# Patient Record
Sex: Female | Born: 1980 | Hispanic: No | Marital: Married | Smoking: Never smoker
Health system: Southern US, Community
[De-identification: ages and names within clinical notes are randomized; demographics above are authoritative.]

## PROBLEM LIST (undated history)

## (undated) ENCOUNTER — Emergency Department (HOSPITAL_BASED_OUTPATIENT_CLINIC_OR_DEPARTMENT_OTHER): Admission: EM | Payer: Self-pay | Source: Home / Self Care

## (undated) HISTORY — PX: BREAST SURGERY: SHX581

## (undated) HISTORY — PX: REDUCTION MAMMAPLASTY: SUR839

---

## 2002-10-22 ENCOUNTER — Other Ambulatory Visit: Admission: RE | Admit: 2002-10-22 | Discharge: 2002-10-22 | Payer: Self-pay | Admitting: Obstetrics and Gynecology

## 2003-04-18 ENCOUNTER — Inpatient Hospital Stay (HOSPITAL_COMMUNITY): Admission: AD | Admit: 2003-04-18 | Discharge: 2003-04-21 | Payer: Self-pay | Admitting: Obstetrics and Gynecology

## 2003-04-19 ENCOUNTER — Encounter (INDEPENDENT_AMBULATORY_CARE_PROVIDER_SITE_OTHER): Payer: Self-pay | Admitting: *Deleted

## 2005-06-20 ENCOUNTER — Inpatient Hospital Stay (HOSPITAL_COMMUNITY): Admission: AD | Admit: 2005-06-20 | Discharge: 2005-06-22 | Payer: Self-pay | Admitting: Obstetrics

## 2009-04-02 ENCOUNTER — Emergency Department (HOSPITAL_BASED_OUTPATIENT_CLINIC_OR_DEPARTMENT_OTHER): Admission: EM | Admit: 2009-04-02 | Discharge: 2009-04-02 | Payer: Self-pay | Admitting: Emergency Medicine

## 2009-04-02 ENCOUNTER — Ambulatory Visit: Payer: Self-pay | Admitting: Diagnostic Radiology

## 2009-04-03 ENCOUNTER — Emergency Department (HOSPITAL_BASED_OUTPATIENT_CLINIC_OR_DEPARTMENT_OTHER): Admission: EM | Admit: 2009-04-03 | Discharge: 2009-04-04 | Payer: Self-pay | Admitting: Emergency Medicine

## 2009-04-04 ENCOUNTER — Ambulatory Visit: Payer: Self-pay | Admitting: Diagnostic Radiology

## 2010-05-29 LAB — COMPREHENSIVE METABOLIC PANEL
ALT: 32 U/L (ref 0–35)
AST: 53 U/L — ABNORMAL HIGH (ref 0–37)
Albumin: 3.7 g/dL (ref 3.5–5.2)
Alkaline Phosphatase: 80 U/L (ref 39–117)
BUN: 10 mg/dL (ref 6–23)
CO2: 30 mEq/L (ref 19–32)
Calcium: 8.8 mg/dL (ref 8.4–10.5)
Chloride: 103 mEq/L (ref 96–112)
Creatinine, Ser: 0.6 mg/dL (ref 0.4–1.2)
GFR calc Af Amer: >60 mL/min (ref >60)
GFR calc non Af Amer: >60 mL/min (ref >60)
Glucose, Bld: 95 mg/dL (ref 70–99)
Potassium: 3.8 mEq/L (ref 3.5–5.1)
Sodium: 139 mEq/L (ref 135–145)
Total Bilirubin: 0.2 mg/dL — ABNORMAL LOW (ref 0.3–1.2)
Total Protein: 6.6 g/dL (ref 6.0–8.3)

## 2010-05-29 LAB — URINALYSIS, ROUTINE W REFLEX MICROSCOPIC
Bilirubin Urine: NEGATIVE
Glucose, UA: NEGATIVE mg/dL
Hgb urine dipstick: NEGATIVE
Ketones, ur: NEGATIVE mg/dL
Nitrite: NEGATIVE
Protein, ur: NEGATIVE mg/dL
Specific Gravity, Urine: 1.011 (ref 1.005–1.030)
Urobilinogen, UA: 0.2 mg/dL (ref 0.0–1.0)
pH: 6 (ref 5.0–8.0)

## 2010-05-29 LAB — DIFFERENTIAL
Basophils Absolute: 0 10*3/uL (ref 0.0–0.1)
Basophils Relative: 0 % (ref 0–1)
Eosinophils Absolute: 0.1 10*3/uL (ref 0.0–0.7)
Eosinophils Relative: 1 % (ref 0–5)
Lymphocytes Relative: 12 % (ref 12–46)
Lymphs Abs: 1.2 10*3/uL (ref 0.7–4.0)
Monocytes Absolute: 0.4 10*3/uL (ref 0.1–1.0)
Monocytes Relative: 4 % (ref 3–12)
Neutro Abs: 7.9 10*3/uL — ABNORMAL HIGH (ref 1.7–7.7)
Neutrophils Relative %: 83 % — ABNORMAL HIGH (ref 43–77)

## 2010-05-29 LAB — CBC
HCT: 28.6 % — ABNORMAL LOW (ref 36.0–46.0)
Hemoglobin: 9.1 g/dL — ABNORMAL LOW (ref 12.0–15.0)
MCHC: 32 g/dL (ref 30.0–36.0)
MCV: 67.6 fL — ABNORMAL LOW (ref 78.0–100.0)
Platelets: 309 10*3/uL (ref 150–400)
RBC: 4.23 MIL/uL (ref 3.87–5.11)
RDW: 16.2 % — ABNORMAL HIGH (ref 11.5–15.5)
WBC: 9.6 10*3/uL (ref 4.0–10.5)

## 2010-05-29 LAB — POCT CARDIAC MARKERS
CKMB, poc: <1 ng/mL — ABNORMAL LOW (ref 1.0–8.0)
Myoglobin, poc: 28.9 ng/mL (ref 12–200)
Troponin i, poc: <0.05 ng/mL (ref 0.00–0.09)

## 2010-05-29 LAB — PREGNANCY, URINE: Preg Test, Ur: NEGATIVE

## 2010-05-29 LAB — LIPASE, BLOOD: Lipase: 99 U/L (ref 23–300)

## 2011-03-13 HISTORY — PX: CHOLECYSTECTOMY: SHX55

## 2018-05-06 ENCOUNTER — Encounter: Payer: Self-pay | Admitting: Student

## 2018-05-26 ENCOUNTER — Telehealth: Payer: Self-pay | Admitting: Family Medicine

## 2018-05-26 NOTE — Telephone Encounter (Signed)
Attempted to call patient to inform her about the office restrictions due to the coronavirus. No answer, left a detailed message with the restrictions along with office number if needing to reschedule.

## 2018-05-27 ENCOUNTER — Ambulatory Visit: Payer: Self-pay | Admitting: Student

## 2018-05-29 ENCOUNTER — Encounter: Payer: Self-pay | Admitting: *Deleted

## 2020-03-06 ENCOUNTER — Other Ambulatory Visit: Payer: Self-pay

## 2020-03-06 ENCOUNTER — Emergency Department (HOSPITAL_BASED_OUTPATIENT_CLINIC_OR_DEPARTMENT_OTHER)
Admission: EM | Admit: 2020-03-06 | Discharge: 2020-03-06 | Disposition: A | Discharge status: Home / Self Care | Payer: BLUE CROSS/BLUE SHIELD | Attending: Emergency Medicine | Admitting: Emergency Medicine

## 2020-03-06 ENCOUNTER — Encounter (HOSPITAL_BASED_OUTPATIENT_CLINIC_OR_DEPARTMENT_OTHER): Payer: Self-pay | Admitting: Emergency Medicine

## 2020-03-06 DIAGNOSIS — N61 Mastitis without abscess: Secondary | ICD-10-CM | POA: Insufficient documentation

## 2020-03-06 LAB — COMPREHENSIVE METABOLIC PANEL
ALT: 20 U/L (ref 0–44)
AST: 19 U/L (ref 15–41)
Albumin: 3.7 g/dL (ref 3.5–5.0)
Alkaline Phosphatase: 67 U/L (ref 38–126)
Anion gap: 9 (ref 5–15)
BUN: 7 mg/dL (ref 6–20)
CO2: 26 mmol/L (ref 22–32)
Calcium: 9.3 mg/dL (ref 8.9–10.3)
Chloride: 101 mmol/L (ref 98–111)
Creatinine, Ser: 0.54 mg/dL (ref 0.44–1.00)
GFR, Estimated: >60 mL/min (ref >60)
Glucose, Bld: 92 mg/dL (ref 70–99)
Potassium: 3.9 mmol/L (ref 3.5–5.1)
Sodium: 136 mmol/L (ref 135–145)
Total Bilirubin: 0.3 mg/dL (ref 0.3–1.2)
Total Protein: 7.1 g/dL (ref 6.5–8.1)

## 2020-03-06 LAB — CBC WITH DIFFERENTIAL/PLATELET
Abs Immature Granulocytes: 0.03 10*3/uL (ref 0.00–0.07)
Basophils Absolute: 0.1 10*3/uL (ref 0.0–0.1)
Basophils Relative: 1 %
Eosinophils Absolute: 0.3 10*3/uL (ref 0.0–0.5)
Eosinophils Relative: 4 %
HCT: 40.7 % (ref 36.0–46.0)
Hemoglobin: 13.3 g/dL (ref 12.0–15.0)
Immature Granulocytes: 0 %
Lymphocytes Relative: 22 %
Lymphs Abs: 1.9 10*3/uL (ref 0.7–4.0)
MCH: 25.7 pg — ABNORMAL LOW (ref 26.0–34.0)
MCHC: 32.7 g/dL (ref 30.0–36.0)
MCV: 78.7 fL — ABNORMAL LOW (ref 80.0–100.0)
Monocytes Absolute: 0.4 10*3/uL (ref 0.1–1.0)
Monocytes Relative: 5 %
Neutro Abs: 5.8 10*3/uL (ref 1.7–7.7)
Neutrophils Relative %: 68 %
Platelets: 386 10*3/uL (ref 150–400)
RBC: 5.17 MIL/uL — ABNORMAL HIGH (ref 3.87–5.11)
RDW: 24.3 % — ABNORMAL HIGH (ref 11.5–15.5)
WBC: 8.5 10*3/uL (ref 4.0–10.5)
nRBC: 0 % (ref 0.0–0.2)

## 2020-03-06 LAB — LACTIC ACID, PLASMA: Lactic Acid, Venous: 1 mmol/L (ref 0.5–1.9)

## 2020-03-06 MED ORDER — SODIUM CHLORIDE 0.9 % IV SOLN
INTRAVENOUS | Status: DC | PRN
  Administered 2020-03-06: 250 mL via INTRAVENOUS

## 2020-03-06 MED ORDER — DOXYCYCLINE HYCLATE 100 MG PO CAPS: 100.0000 mg | ORAL_CAPSULE | Freq: Two times a day (BID) | ORAL | 0 refills | Status: AC

## 2020-03-06 MED ORDER — CLINDAMYCIN PHOSPHATE 600 MG/50ML IV SOLN
600.0000 mg | Freq: Once | INTRAVENOUS | Status: AC
  Administered 2020-03-06: 600 mg via INTRAVENOUS
  Filled 2020-03-06: qty 50

## 2020-03-06 NOTE — Discharge Instructions (Addendum)
You are seen today for cellulitis of your breast, I want you to follow-up with general surgery.  Please call Dr. Carolynne Edouard to make an appointment, I need you to have follow-up in the next 2 days.  If you can not get an appointment come back to the ER for wound check. Your blood work was reassuring today, however your infection can spread and worsen, if this does occur the need to come back to the emergency department. If it goes beyond the borders marked please com back here. Please use the attached instructions.  I want you to take your medications as prescribed.  Please speak to your pharmacist in regards to side effects of these medications and interactions.  Get help right away if: Your symptoms get worse. You feel very sleepy. You throw up (vomit) or have watery poop (diarrhea) for a long time. You see red streaks coming from the area. Your red area gets larger. Your red area turns dark in color.

## 2020-03-06 NOTE — ED Notes (Signed)
ED Provider at bedside. 

## 2020-03-06 NOTE — ED Triage Notes (Signed)
Pt arrives pov with driver with c/o left breast pain and concern for possible post surgical infection x 4 days. Pt endorses breast [place], no follow up

## 2020-03-06 NOTE — ED Provider Notes (Signed)
[facility] EMERGENCY DEPARTMENT Provider Note   CSN: [id number] Arrival date & time: 03/06/20  1143     History Chief Complaint  Patient presents with  . Breast Problem    Debbie Villanueva is a 39 y.o. female w/ past medical history that presents to the emergency department today for rash.  Patient states that she had a breast lift surgery done in [place] on November 20, states that she took antibiotics there and stayed there for 21 days without any immediate complications to the surgery.  Patient was discharged from their hospital and came home on December 6.  Patient states that there was no complications until 4 days ago when she noticed that her left breast started having some swelling, redness and felt hard.  Some drainage noted near incision.  Denies any bleeding.  States that it has been worsening over the past 4 days.  Has not tried anything for this.  Denies any fevers, chills, nausea, vomiting. No nipple pain or nipple discharge. States that this was just a breast lift surgery, no implants were placed.  Denies any history of diabetes, states that she is generally healthy.  No chest pain or shortness of breath.  No other complaints. HPI     No past medical history on file.  There are no problems to display for this patient.   OB History   No obstetric history on file.     No family history on file.  Social History   Tobacco Use  . Smoking status: Never Smoker  Substance Use Topics  . Alcohol use: Not Currently  . Drug use: Never    Home Medications Prior to Admission medications   Medication Sig Start Date End Date Taking? Authorizing Provider  doxycycline (VIBRAMYCIN) 100 MG capsule Take 1 capsule (100 mg total) by mouth 2 (two) times daily for 10 days. 03/06/20 03/16/20  Farrel Gordon, PA-C    Allergies    Patient has no allergy information on record.  Review of Systems   Review of Systems  Constitutional: Negative for chills,  diaphoresis, fatigue and fever.  HENT: Negative for congestion, sore throat and trouble swallowing.   Eyes: Negative for pain and visual disturbance.  Respiratory: Negative for cough, shortness of breath and wheezing.   Cardiovascular: Negative for chest pain, palpitations and leg swelling.  Gastrointestinal: Negative for abdominal distention, abdominal pain, diarrhea, nausea and vomiting.  Genitourinary: Negative for difficulty urinating.  Musculoskeletal: Negative for back pain, neck pain and neck stiffness.  Skin: Positive for color change and wound. Negative for pallor.  Neurological: Negative for dizziness, speech difficulty, weakness and headaches.  Psychiatric/Behavioral: Negative for confusion.    Physical Exam Updated Vital Signs BP 111/71 (BP Location: Right Arm)   Pulse 80   Temp 98 F (36.7 C) (Oral)   Resp 16   Ht 5\' 2"  (1.575 m)   Wt 79.4 kg   LMP 02/13/2020   SpO2 100%   BMI 32.01 kg/m   Physical Exam Constitutional:      General: She is not in acute distress.    Appearance: Normal appearance. She is not ill-appearing, toxic-appearing or diaphoretic.  HENT:     Mouth/Throat:     Mouth: Mucous membranes are moist.     Pharynx: Oropharynx is clear.  Eyes:     General: No scleral icterus.    Extraocular Movements: Extraocular movements intact.     Pupils: Pupils are equal, round, and reactive to light.  Cardiovascular:  Rate and Rhythm: Normal rate and regular rhythm.     Pulses: Normal pulses.     Heart sounds: Normal heart sounds.  Pulmonary:     Effort: Pulmonary effort is normal. No respiratory distress.     Breath sounds: Normal breath sounds. No stridor. No wheezing, rhonchi or rales.  Chest:     Chest wall: No tenderness.       Comments: Patient with left breast with surgical scar in place from nipple vertically down to edge of breast.  Surgical scar does appear infected, mild drainage. No fluctuance or abscess felt.  Erythema and swelling noted  surrounding the lesion and under most of the entire breast. There is some induration surrounding the erythema.There is some skin peeling also noted underneath the breast area.  No nipple discharge, nipple lumps.  Area is warm.  No masses felt. No adenopathy.   Right breast with surgical scar intact, noninfectious.  Normal. Abdominal:     General: Abdomen is flat. There is no distension.     Palpations: Abdomen is soft.     Tenderness: There is no abdominal tenderness. There is no guarding or rebound.  Musculoskeletal:        General: No swelling or tenderness. Normal range of motion.     Cervical back: Normal range of motion and neck supple. No rigidity.     Right lower leg: No edema.     Left lower leg: No edema.  Skin:    General: Skin is warm and dry.     Capillary Refill: Capillary refill takes less than 2 seconds.     Coloration: Skin is not pale.  Neurological:     General: No focal deficit present.     Mental Status: She is alert and oriented to person, place, and time.  Psychiatric:        Mood and Affect: Mood normal.        Behavior: Behavior normal.     ED Results / Procedures / Treatments   Labs (all labs ordered are listed, but only abnormal results are displayed) Labs Reviewed  CBC WITH DIFFERENTIAL/PLATELET - Abnormal; Notable for the following components:      Result Value   RBC 5.17 (*)    MCV 78.7 (*)    MCH 25.7 (*)    RDW 24.3 (*)    All other components within normal limits  COMPREHENSIVE METABOLIC PANEL  LACTIC ACID, PLASMA    EKG None  Radiology No results found.  Procedures Procedures (including critical care time)  Medications Ordered in ED Medications  0.9 %  sodium chloride infusion ( Intravenous Stopped 03/06/20 1705)  clindamycin (CLEOCIN) IVPB 600 mg (0 mg Intravenous Stopped 03/06/20 1704)    ED Course  I have reviewed the triage vital signs and the nursing notes.  Pertinent labs & imaging results that were available during my  care of the patient were reviewed by me and considered in my medical decision making (see chart for details).    MDM Rules/Calculators/A&P                         Debbie Villanueva is a 39 y.o. female past medical history the presents to the emergency department today for rash.  Patient does appear to have cellulitis with wound complication from prior surgery, was able to mark this with a pen.  Was not able to feel any fluctuance, do not think that there is an abscess forming.  No crepitus, no concerns of deep space infection. Dr. Lynelle Doctor did access this as well.  CMP unremarkable, CBC without any leukocytosis.  Lactic acid normal.  Afebrile, nontachycardic, patient appears very well.  Patient without any systemic symptoms, will have patient follow-up with general surgery and the breast center in regards to his cellulitis.  Did discuss if she cannot follow-up with them in the next 2 days to come back to the emergency department for wound check.  Antibiotics given.  Doubt need for further emergent work up at this time. I explained the diagnosis and have given explicit precautions to return to the ER including for any other new or worsening symptoms. The patient understands and accepts the medical plan as it's been dictated and I have answered their questions. Discharge instructions concerning home care and prescriptions have been given. The patient is STABLE and is discharged to home in good condition.  I discussed this case with my attending physician, Dr. Lynelle Doctor, who cosigned this note including patient's presenting symptoms, physical exam, and planned diagnostics and interventions. Attending physician stated agreement with plan or made changes to plan which were implemented.   Attending physician assessed patient at bedside.  Final Clinical Impression(s) / ED Diagnoses Final diagnoses:  Cellulitis of breast    Rx / DC Orders ED Discharge Orders         Ordered    doxycycline (VIBRAMYCIN)  100 MG capsule  2 times daily        03/06/20 1612           Farrel Gordon, PA-C 03/06/20 1754    Linwood Dibbles, MD 03/07/20 [id number]

## 2020-03-10 ENCOUNTER — Other Ambulatory Visit: Payer: Self-pay

## 2020-03-10 ENCOUNTER — Emergency Department (HOSPITAL_BASED_OUTPATIENT_CLINIC_OR_DEPARTMENT_OTHER)
Admission: EM | Admit: 2020-03-10 | Discharge: 2020-03-10 | Disposition: A | Discharge status: Home / Self Care | Payer: Self-pay | Attending: Emergency Medicine | Admitting: Emergency Medicine

## 2020-03-10 ENCOUNTER — Encounter (HOSPITAL_BASED_OUTPATIENT_CLINIC_OR_DEPARTMENT_OTHER): Payer: Self-pay | Admitting: Emergency Medicine

## 2020-03-10 DIAGNOSIS — Z5189 Encounter for other specified aftercare: Secondary | ICD-10-CM

## 2020-03-10 DIAGNOSIS — T8149XA Infection following a procedure, other surgical site, initial encounter: Secondary | ICD-10-CM | POA: Insufficient documentation

## 2020-03-10 DIAGNOSIS — T8140XA Infection following a procedure, unspecified, initial encounter: Secondary | ICD-10-CM

## 2020-03-10 DIAGNOSIS — Z4800 Encounter for change or removal of nonsurgical wound dressing: Secondary | ICD-10-CM | POA: Insufficient documentation

## 2020-03-10 NOTE — Discharge Instructions (Signed)
Continue taking the antibiotics.  Keep the area dry.  I have written you for additional contact information for a second opinion with a Engineer, petroleum.  You will need to call to schedule appointment.  Return for any worsening symptoms.

## 2020-03-10 NOTE — ED Notes (Signed)
Pt here for recheck of wound on lower left breast.  Pt was sent for referral but dr said he would not take care of this type wound per patient.

## 2020-03-10 NOTE — ED Provider Notes (Signed)
[facility] EMERGENCY DEPARTMENT Provider Note   CSN: [id number] Arrival date & time: 03/10/20  1157    History Chief Complaint  Patient presents with  . Wound Check    Debbie Villanueva is a 39 y.o. female with no significant past medical history who presents for evaluation of wound to left breast.  [place]went breast lift and br[place]oplasty in [place] on January 30, 2020.  Discharge on February 15, 2020.  On 03/02/2020 she noted that her left breast started having some redness, swelling and induration.  She denies any bleeding.  States she had noticed that her wound had "opened up.".  She has no nipple pain or nipple discharge.  She has no implants that were placed.  She denies fever, chills, nausea, vomiting, chest pain, shortness of breath.  Currently on doxycycline.  She was actually seen by plastic surgery, Dr. Leta Baptist with [facility] in office yesterday.  She encouraged patient to complete antibiotics and she was going to follow-up for wound recheck. Patient's main complaints is that she would like sutures to the dehisced wound. States plastic surgery would not placed sutures. She would like referral for second opinion. Denies additional aggravating or alleviating factors.  History obtained from patient and past medical records. No interpretor was used.    Marland Kitchen HPI     No past medical history on file.  There are no problems to display for this patient.   Past Surgical History:  Procedure Laterality Date  . BREAST SURGERY Left      OB History   No obstetric history on file.     No family history on file.  Social History   Tobacco Use  . Smoking status: Never Smoker  Substance Use Topics  . Alcohol use: Not Currently  . Drug use: Never    Home Medications Prior to Admission medications   Medication Sig Start Date End Date Taking? Authorizing Provider  doxycycline (VIBRAMYCIN) 100 MG capsule Take 1 capsule (100 mg total) by mouth 2 (two) times  daily for 10 days. 03/06/20 03/16/20 Yes Farrel Gordon, PA-C    Allergies    Patient has no known allergies.  Review of Systems   Review of Systems  Constitutional: Negative.   HENT: Negative.   Respiratory: Negative.   Cardiovascular: Negative.   Gastrointestinal: Negative.   Genitourinary: Negative.   Musculoskeletal: Negative.   Skin: Positive for wound.  Neurological: Negative.   All other systems reviewed and are negative.   Physical Exam Updated Vital Signs BP 111/71 (BP Location: Right Arm)   Pulse 69   Temp 98.1 F (36.7 C) (Oral)   Resp 18   LMP 02/13/2020   SpO2 100%   Physical Exam Vitals and nursing note reviewed.  Constitutional:      General: She is not in acute distress.    Appearance: She is well-developed and well-nourished. She is not ill-appearing, toxic-appearing or diaphoretic.  HENT:     Head: Normocephalic and atraumatic.     Nose: Nose normal.     Mouth/Throat:     Mouth: Mucous membranes are moist.  Eyes:     Pupils: Pupils are equal, round, and reactive to light.  Cardiovascular:     Rate and Rhythm: Normal rate.     Pulses: Normal pulses and intact distal pulses.     Heart sounds: Normal heart sounds.  Pulmonary:     Effort: Pulmonary effort is normal. No respiratory distress.     Breath sounds: Normal breath sounds.  Abdominal:     General: Bowel sounds are normal. There is no distension.  Musculoskeletal:        General: No swelling, tenderness, deformity or signs of injury. Normal range of motion.     Cervical back: Normal range of motion.     Right lower leg: No edema.     Left lower leg: No edema.  Skin:    General: Skin is warm and dry.     Capillary Refill: Capillary refill takes less than 2 seconds.     Comments: 2.5 round wound to inferior breast. No drainage. Granulated tissue to center. Surrounding erythema without warmth. No nipple drainage. No obvious abscess  Neurological:     General: No focal deficit present.      Mental Status: She is alert and oriented to person, place, and time.  Psychiatric:        Mood and Affect: Mood and affect normal.       ED Results / Procedures / Treatments   Labs (all labs ordered are listed, but only abnormal results are displayed) Labs Reviewed - No data to display  EKG None  Radiology No results found.  Procedures Procedures (including critical care time)  Medications Ordered in ED Medications - No data to display  ED Course  I have reviewed the triage vital signs and the nursing notes.  Pertinent labs & imaging results that were available during my care of the patient were reviewed by me and considered in my medical decision making (see chart for details).  39 year old presents for evaluation of breast wound.  Seen here in ED 4 days ago.  Unfortunately has complication from breast lift which she did in [place] approximate 1 month ago.  Was started on doxycycline during prior visit.  She is actually seen by plastic surgeon yesterday.  They recommended her following up outpatient, continue on antibiotics, keeping wound dry.  Patient states wound has not worsened.  No drainage.  Does have some erythema.  See picture in chart.  Clinically she does not appear septic.  She is afebrile without any tachycardia, tachypnea or hypoxia.  Patient's main complaint is that she would like sutures placed to her dehisced wound.  I discussed with patient unable to close this has this needs to heal.  She would like referral to a different plastic surgeon for second opinion.. We will give her information for different plastic surgeon.  At this time she does not have any gross abscess.  Wound according to patient with erythema has actually improved since antibiotics.  I discussed completing these.  She will return for any new or worsening symptoms.  The patient has been appropriately medically screened and/or stabilized in the ED. I have low suspicion for any other emergent medical  condition which would require further screening, evaluation or treatment in the ED or require inpatient management.  Patient is hemodynamically stable and in no acute distress.  Patient able to ambulate in department prior to ED.  Evaluation does not show acute pathology that would require ongoing or additional emergent interventions while in the emergency department or further inpatient treatment.  I have discussed the diagnosis with the patient and answered all questions.  Pain is been managed while in the emergency department and patient has no further complaints prior to discharge.  Patient is comfortable with plan discussed in room and is stable for discharge at this time.  I have discussed strict return precautions for returning to the emergency department.  Patient was encouraged  to follow-up with PCP/specialist refer to at discharge.     MDM Rules/Calculators/A&P                           Final Clinical Impression(s) / ED Diagnoses Final diagnoses:  Visit for wound check  Postoperative infection, unspecified type, initial encounter    Rx / DC Orders ED Discharge Orders    None       Idonia Zollinger A, PA-C 03/10/20 1558    Tegeler, Canary Brim, MD 03/10/20 2348

## 2020-03-10 NOTE — ED Triage Notes (Signed)
Pt reports was seen 4 days ago for breast surgical incesion and possible infectionwound, prescribed antibiotic and got referral yet unable to get a follow up. Was asked to return to the Ed. No symptoms.

## 2020-04-13 ENCOUNTER — Institutional Professional Consult (permissible substitution): Payer: Self-pay | Admitting: Plastic Surgery

## 2021-04-21 ENCOUNTER — Encounter: Payer: Self-pay | Admitting: Obstetrics and Gynecology

## 2021-04-21 ENCOUNTER — Other Ambulatory Visit: Payer: Self-pay

## 2021-04-25 ENCOUNTER — Other Ambulatory Visit: Payer: Self-pay | Admitting: Obstetrics and Gynecology

## 2021-04-25 DIAGNOSIS — Z1231 Encounter for screening mammogram for malignant neoplasm of breast: Secondary | ICD-10-CM

## 2021-06-01 ENCOUNTER — Other Ambulatory Visit: Payer: Self-pay

## 2021-06-01 ENCOUNTER — Ambulatory Visit: Payer: Self-pay | Admitting: *Deleted

## 2021-06-01 ENCOUNTER — Ambulatory Visit
Admission: RE | Admit: 2021-06-01 | Discharge: 2021-06-01 | Disposition: A | Discharge status: Home / Self Care | Payer: No Typology Code available for payment source | Source: Ambulatory Visit | Attending: Obstetrics and Gynecology | Admitting: Obstetrics and Gynecology

## 2021-06-01 VITALS — BP 118/72 | Wt 182.4 lb

## 2021-06-01 DIAGNOSIS — Z01419 Encounter for gynecological examination (general) (routine) without abnormal findings: Secondary | ICD-10-CM

## 2021-06-01 DIAGNOSIS — Z1231 Encounter for screening mammogram for malignant neoplasm of breast: Secondary | ICD-10-CM

## 2021-06-01 NOTE — Patient Instructions (Signed)
Explained breast self awareness with Debbie Villanueva. Pap smear completed today. Let her know [facility] will cover Pap smears and HPV typing every 5 years unless has a history of abnormal Pap smears. Referred patient to the [facility] for a screening mammogram on mobile unit. Appointment scheduled Thursday, June 01, 2021 at 1330. Patient aware of appointment and will be there. Let patient know will follow up with her within the next couple weeks with results of Pap smear by phone. Informed patient that the Breast Center will follow up with her within the next couple of weeks with results of her mammogram by letter or phone. Debbie Villanueva verbalized understanding. ? ?Jamarr Treinen, Kathaleen Maser, RN ?12:50 PM ? ? ? ? ?

## 2021-06-01 NOTE — Progress Notes (Addendum)
Ms. Debbie Villanueva is a 41 y.o. No obstetric history on file. female who presents to [facility] clinic today with complaint of bilateral nipple pain since her breast reduction surgery in November 2022 that happens in the evening and when she is tired on the weekends per patient . Patient rates the pain at a 2.5 out of 10.  ?  ?Pap Smear: Pap smear completed today. Last Pap smear was in 2008 at the [facility] clinic and was normal per patient. Per patient has no history of an abnormal Pap smear. Last Pap smear result is not available in Epic. ?  ?Physical exam: ?Breasts ?Breasts symmetrical. Scars observed bilateral lower breasts and under nipples from breast reduction surgery in November 2022. The scar under the left breast was larger due to patient stated the stitches open and became infected. No nipple retraction bilateral breasts. No nipple discharge bilateral breasts. No lymphadenopathy. No lumps palpated bilateral breasts. No complaints of pain or tenderness on exam.     ?  ?Pelvic/Bimanual ?Ext Genitalia ?No lesions, no swelling and no discharge observed on external genitalia.      ?  ?Vagina ?Vagina pink and normal texture. No lesions or discharge observed in vagina.      ?  ?Cervix ?Cervix is present. Cervix pink and of normal texture. Large mass observed from cervix. Will refer patient to the [facility] for [place] for follow up. Explained to patient that follow will not be covered by [facility], unless related to the Pap smear result. No discharge observed.  ?  ?Uterus ?Uterus is present and palpable. Uterus in normal position and normal size.      ?  ?Adnexae ?Bilateral ovaries present and palpable. No tenderness on palpation.       ?  ?Rectovaginal ?No rectal exam completed today since patient had no rectal complaints. No skin abnormalities observed on exam.   ?  ?Smoking History: ?Patient has never smoked. ?  ?Patient Navigation: ?Patient education provided.  Access to services provided for patient through [place] program. Spanish interpreter Rudene Anda from [facility] provided.  ?  ?Breast and Cervical Cancer Risk Assessment: ?Patient does not have family history of breast cancer, known genetic mutations, or radiation treatment to the chest before age 59. Patient does not have history of cervical dysplasia, immunocompromised, or DES exposure in-utero. ? ?Risk Assessment   ? ? Risk Scores   ? ?   06/01/2021  ? Last edited by: Demetrius Revel, LPN  ? 5-year risk: 0.4 %  ? Lifetime risk: 5.6 %  ? ?  ?  ? ?  ? ? ?A: ?[facility] exam with pap smear ?Complaint of bilateral breast pain since breast reduction surgery. ? ?P: ?Referred patient to the [place] for a screening mammogram on mobile unit. Appointment scheduled Thursday, June 01, 2021 at 1330. ? ?Loletta Parish, RN ?06/01/2021 12:50 PM   ?

## 2021-06-06 ENCOUNTER — Other Ambulatory Visit: Payer: Self-pay | Admitting: Obstetrics and Gynecology

## 2021-06-06 DIAGNOSIS — R928 Other abnormal and inconclusive findings on diagnostic imaging of breast: Secondary | ICD-10-CM

## 2021-06-06 LAB — CYTOLOGY - PAP
Comment: NEGATIVE
Diagnosis: NEGATIVE
High risk HPV: NEGATIVE

## 2021-06-07 ENCOUNTER — Telehealth: Payer: Self-pay

## 2021-06-07 NOTE — Telephone Encounter (Signed)
Via Annice Pih, Spanish Interpreter [facility]), Left message on voicemail requesting a return call. (Attempted to contact patient regarding pap results).  ?

## 2021-06-20 ENCOUNTER — Telehealth: Payer: Self-pay

## 2021-06-20 NOTE — Telephone Encounter (Signed)
Via Delorise Royals, Spanish Interpreter [facility]) Patient informed negative Pap/HPV results, repeat pap in 5 years. Patient verbalized understanding.  ?

## 2021-06-30 ENCOUNTER — Other Ambulatory Visit: Payer: Self-pay | Admitting: Obstetrics and Gynecology

## 2021-06-30 ENCOUNTER — Ambulatory Visit
Admission: RE | Admit: 2021-06-30 | Discharge: 2021-06-30 | Disposition: A | Discharge status: Home / Self Care | Payer: No Typology Code available for payment source | Source: Ambulatory Visit | Attending: Obstetrics and Gynecology | Admitting: Obstetrics and Gynecology

## 2021-06-30 DIAGNOSIS — R928 Other abnormal and inconclusive findings on diagnostic imaging of breast: Secondary | ICD-10-CM

## 2021-09-20 ENCOUNTER — Encounter: Payer: No Typology Code available for payment source | Admitting: Obstetrics and Gynecology

## 2021-10-11 ENCOUNTER — Ambulatory Visit (INDEPENDENT_AMBULATORY_CARE_PROVIDER_SITE_OTHER): Payer: Self-pay | Admitting: Family Medicine

## 2021-10-11 ENCOUNTER — Encounter: Payer: Self-pay | Admitting: Family Medicine

## 2021-10-11 ENCOUNTER — Other Ambulatory Visit: Payer: Self-pay

## 2021-10-11 DIAGNOSIS — N841 Polyp of cervix uteri: Secondary | ICD-10-CM

## 2021-10-11 DIAGNOSIS — N920 Excessive and frequent menstruation with regular cycle: Secondary | ICD-10-CM

## 2021-10-11 NOTE — Progress Notes (Unsigned)
   GYNECOLOGY OFFICE VISIT NOTE  History:   Debbie Villanueva is a 41 y.o. [phone] here today for follow up of cervical mass.  Patient seen by [facility] on 06/01/2021 for pap smear Pap was NILM, neg HPV Referred to [facility] for evaluation of the mass  Has a regular period, very heavy Gets many clots Lasts for about five days Does not have bleeding with intercourse but sometimes it hurts, more internally than externally Reports she has been told she has fibroids No other women in her family have fibroids that she is aware of No family hx of cancer  ***IUD, polyp removal  Health Maintenance Due  Topic Date Due   INFLUENZA VACCINE  10/10/2021    No past medical history on file.  Past Surgical History:  Procedure Laterality Date   BREAST SURGERY Left    CHOLECYSTECTOMY  2013   REDUCTION MAMMAPLASTY      The following portions of the patient's history were reviewed and updated as appropriate: allergies, current medications, past family history, past medical history, past social history, past surgical history and problem list.   Health Maintenance:   Last pap: Lab Results  Component Value Date   DIAGPAP  06/01/2021    - Negative for intraepithelial lesion or malignancy (NILM)   HPVHIGH Negative 06/01/2021    Last mammogram:  06/30/2021 - BIRADS 3, follow up already scheduled for 01/05/2022  ***reminder message  Review of Systems:  Pertinent items noted in HPI and remainder of comprehensive ROS otherwise negative.  Physical Exam:  BP 110/73   Pulse 76   Wt 185 lb 1.6 oz (84 kg)   LMP 09/09/2021 (Exact Date)   BMI 33.86 kg/m  CONSTITUTIONAL: Well-developed, well-nourished female in no acute distress.  HEENT:  Normocephalic, atraumatic. External right and left ear normal. No scleral icterus.  NECK: Normal range of motion, supple, no masses noted on observation SKIN: No rash noted. Not diaphoretic. No erythema. No pallor. MUSCULOSKELETAL: Normal range of motion. No  edema noted. NEUROLOGIC: Alert and oriented to person, place, and time. Normal muscle tone coordination.  PSYCHIATRIC: Normal mood and affect. Normal behavior. Normal judgment and thought content. RESPIRATORY: Effort normal, no problems with respiration noted ABDOMEN: No masses noted. No other overt distention noted.  *** PELVIC: {Blank single:19197::"Deferred","Normal appearing external genitalia; normal appearing vaginal mucosa and cervix.  No abnormal discharge noted.  Normal uterine size, no other palpable masses, no uterine or adnexal tenderness."}  Labs and Imaging No results found for this or any previous visit (from the past 168 hour(s)). No results found.    Assessment and Plan:   Problem List Items Addressed This Visit   None   Routine preventative health maintenance measures emphasized. Please refer to After Visit Summary for other counseling recommendations.   No follow-ups on file.    Total face-to-face time with patient: {Blank single:19197::"10","15","20","25","30"} minutes.  Over 50% of encounter was spent on counseling and coordination of care.   Venora Maples, MD/MPH Attending Family Medicine Physician, [facility] for [facility], [facility]

## 2021-10-12 DIAGNOSIS — N841 Polyp of cervix uteri: Secondary | ICD-10-CM | POA: Insufficient documentation

## 2021-10-12 DIAGNOSIS — N920 Excessive and frequent menstruation with regular cycle: Secondary | ICD-10-CM | POA: Insufficient documentation

## 2021-10-12 NOTE — Assessment & Plan Note (Signed)
What appears to be a pedunculated polyp is prolapsing from the cervix. Attempted multiple times to twist it off its stalk in both directions, but I met significant resistance and after release the polyp would actually unwind itself back to its neutral position. Given this I recommended to the patient she return at a later date so that I can try to remove it in the procedure room with one of my OBGYN partners, possibly with LEEP vs ligation at the base with sharp removal. Could also be done at same time as IUD insertion (see other problem) and +/- paracervical block. 

## 2021-10-12 NOTE — Assessment & Plan Note (Signed)
Initially reporting she is interested in hysterectomy. I discussed that given her lack of insurance this is likely prohibitively expensive, additionally most physicians would recommend trial of a hormonal IUD first. She was amenable to this plan. I recommended she apply for an IUD scholarship and return in 1-2 months for insertion +/- polyp removal.

## 2021-11-16 ENCOUNTER — Ambulatory Visit: Payer: No Typology Code available for payment source | Admitting: Obstetrics and Gynecology

## 2021-12-13 ENCOUNTER — Ambulatory Visit: Payer: No Typology Code available for payment source | Admitting: Family Medicine

## 2023-04-13 IMAGING — MG DIGITAL DIAGNOSTIC BILAT W/ CAD
8 series · 8 of 8 positions shown · non-contrast
Comparison: Baseline screening mammogram dated 06/01/2021.

CLINICAL DATA: Patient returns today to evaluate calcifications
identified within each breast on a recent baseline screening
mammogram. History of breast reduction surgery approximately 1 year
ago.

EXAM:
DIGITAL DIAGNOSTIC BILATERAL MAMMOGRAM WITH CAD
TECHNIQUE: Bilateral digital diagnostic mammography was performed. Mammographic
images were processed with CAD.

[R CC]
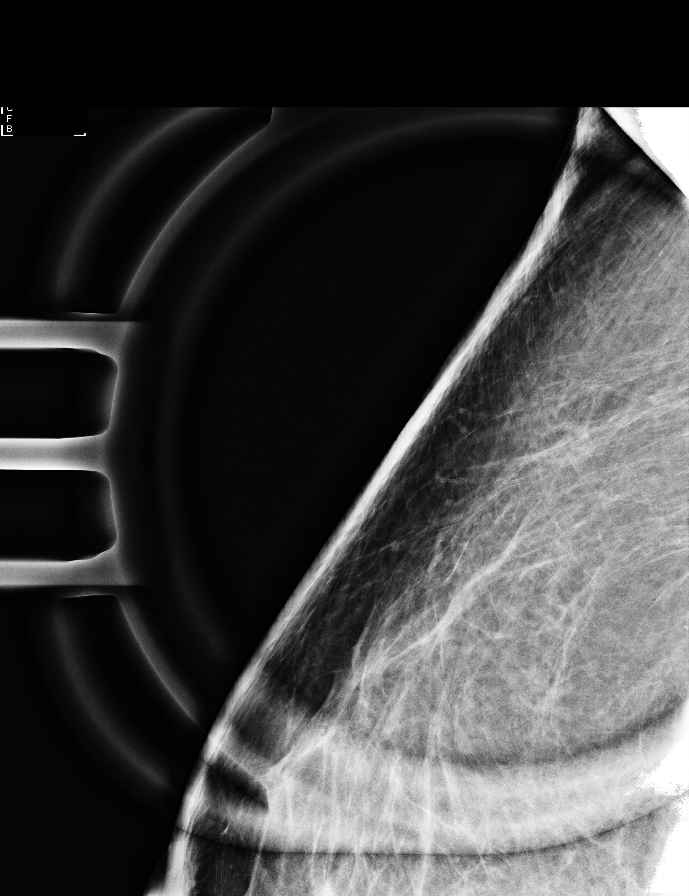

[L ML (1 of 2)]
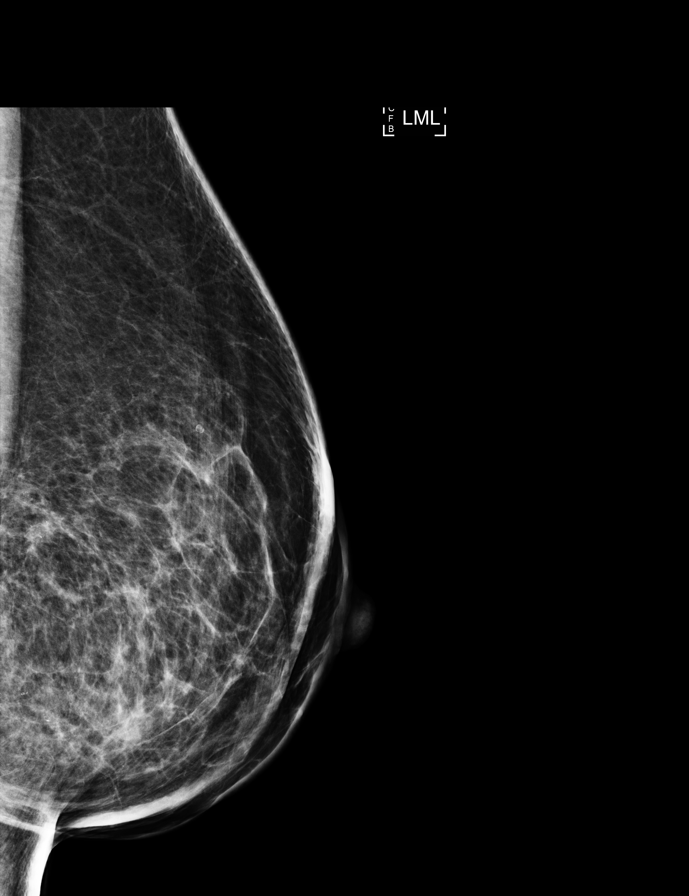

[R ML (1 of 2)]
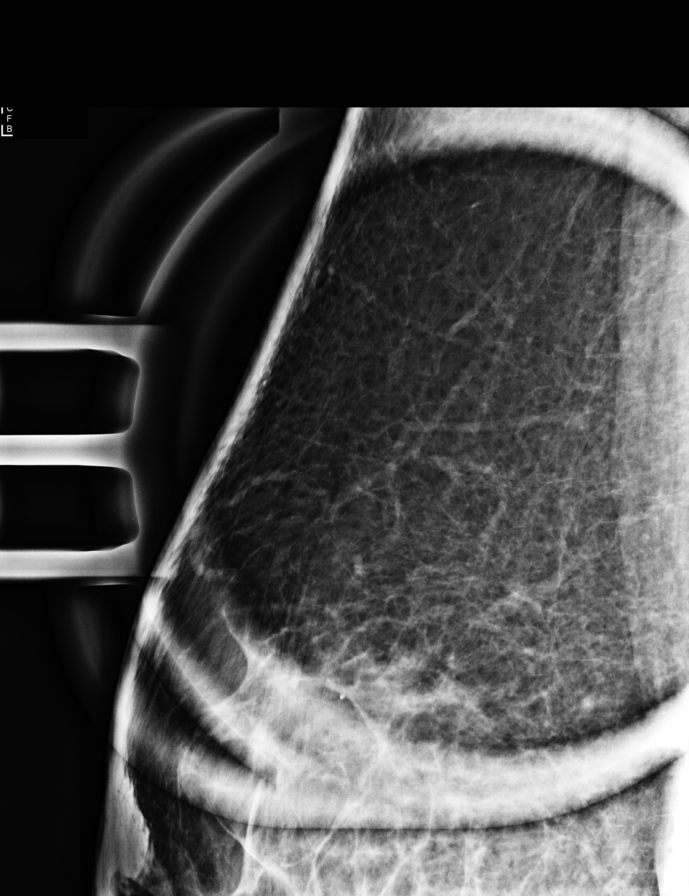

[R MLO (1 of 2)]
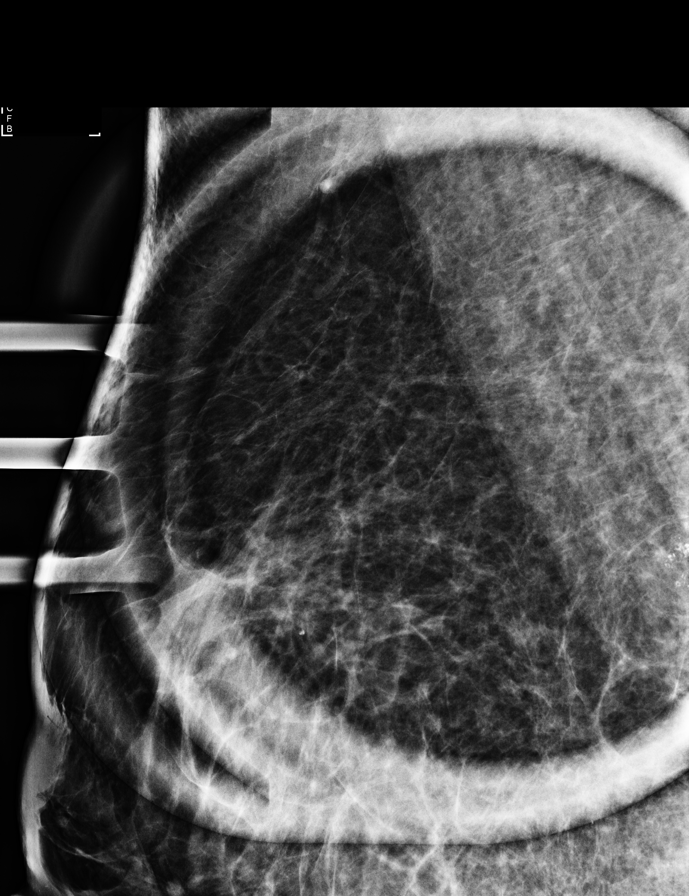

[L CC]
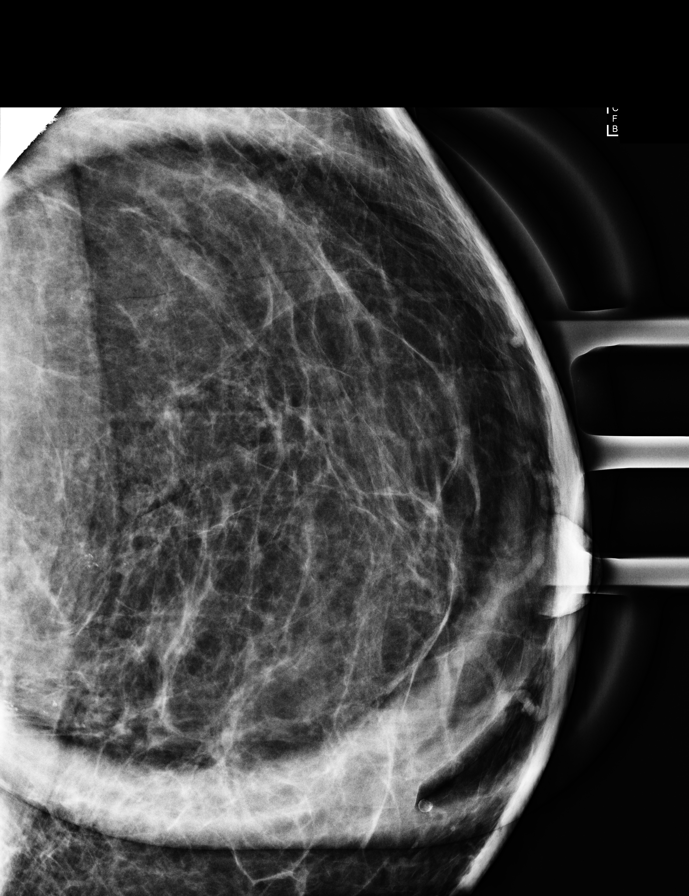

[R MLO (2 of 2)]
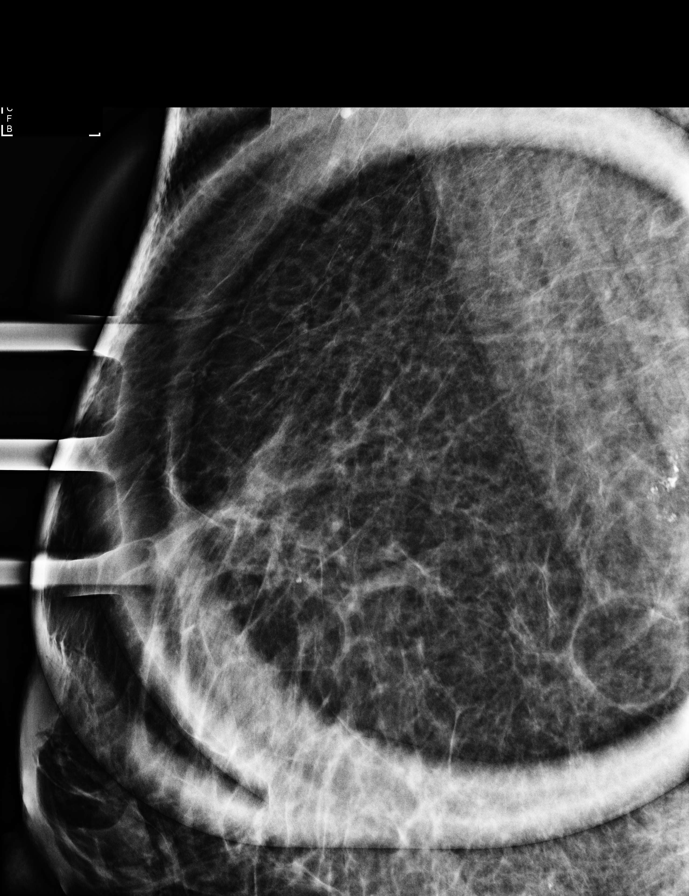

[L ML (2 of 2)]
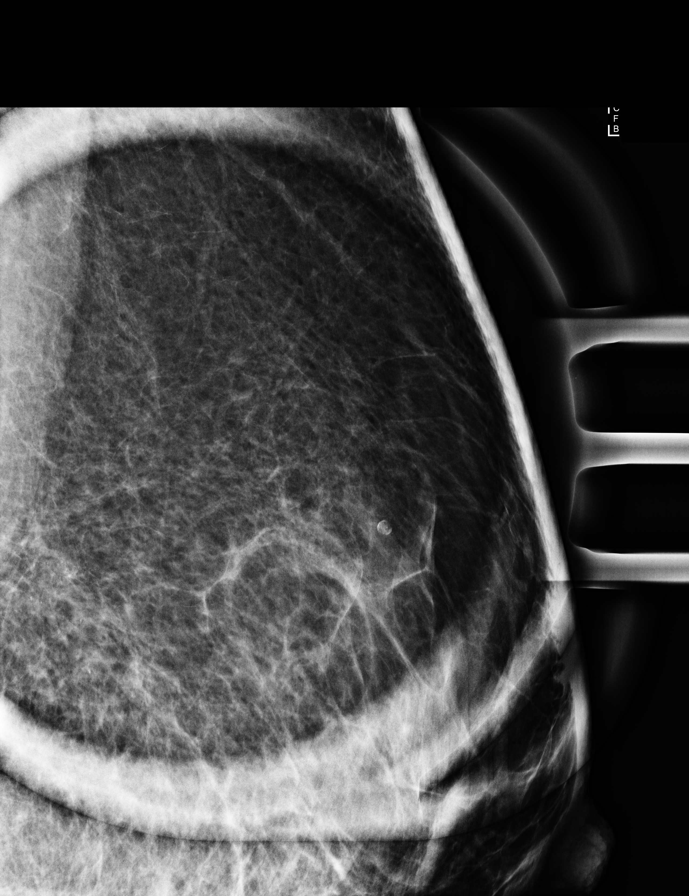

[R ML (2 of 2)]
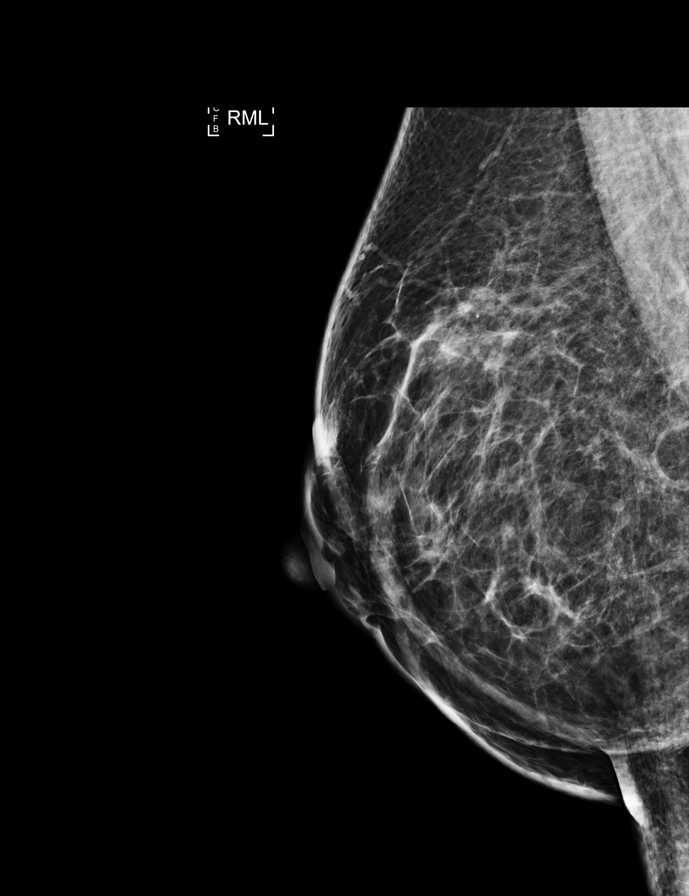

[8 of 8 positions shown; findings below may reference images not displayed]

ACR Breast Density Category b: There are scattered areas of
fibroglandular density.
FINDINGS: There are loosely grouped coarse and punctate calcifications within
the posterior breasts bilaterally, most likely benign
postsurgical/fat necrosis calcifications. No fine linear branching
calcifications identified.
IMPRESSION: Probably benign calcifications within each breast, most likely
benign postsurgical/fat necrosis calcifications related to previous
breast reduction surgery. Recommend follow-up bilateral diagnostic
mammogram in 6 months to ensure stability.

RECOMMENDATION:
Bilateral diagnostic mammogram, with magnification views, in 6
months.

I have discussed the findings and recommendations with the patient.
If applicable, a reminder letter will be sent to the patient
regarding the next appointment.

BI-RADS CATEGORY  3: Probably benign.
